# Patient Record
Sex: Male | Born: 1990 | Race: Black or African American | Hispanic: No | Marital: Single | Smoking: Current every day smoker
Health system: Southern US, Community
[De-identification: ages and names within clinical notes are randomized; demographics above are authoritative.]

---
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
## 2012-05-27 ENCOUNTER — Emergency Department (HOSPITAL_COMMUNITY): Payer: Self-pay

## 2012-05-27 ENCOUNTER — Encounter (HOSPITAL_COMMUNITY): Payer: Self-pay | Admitting: Emergency Medicine

## 2012-05-27 ENCOUNTER — Emergency Department (HOSPITAL_COMMUNITY)
Admission: EM | Admit: 2012-05-27 | Discharge: 2012-05-27 | Disposition: A | Discharge status: Home / Self Care | Payer: Self-pay | Attending: Emergency Medicine | Admitting: Emergency Medicine

## 2012-05-27 DIAGNOSIS — F17200 Nicotine dependence, unspecified, uncomplicated: Secondary | ICD-10-CM | POA: Insufficient documentation

## 2012-05-27 DIAGNOSIS — R0789 Other chest pain: Secondary | ICD-10-CM | POA: Insufficient documentation

## 2012-05-27 DIAGNOSIS — R079 Chest pain, unspecified: Secondary | ICD-10-CM

## 2012-05-27 MED ORDER — NAPROXEN 500 MG PO TABS: 500.0000 mg | ORAL_TABLET | Freq: Two times a day (BID) | ORAL | Status: AC

## 2012-05-27 NOTE — ED Provider Notes (Signed)
History     CSN: [id number]  Arrival date & time 05/27/12  1030   First MD Initiated Contact with Patient 05/27/12 1045      Chief Complaint  Patient presents with  . Chest Pain    (Consider location/radiation/quality/duration/timing/severity/associated sxs/prior treatment) Patient is a 21 y.o. male presenting with chest pain. The history is provided by the patient.  Chest Pain    patient here complaining of chest pain off and on for the past 3-6 months. Located on the right side of his chest and described as sharp in nature. No associated anginal symptoms. Denies any dyspnea or diaphoresis.no fever or cough. No leg pain or swelling. No rashes. No syncope. Nothing makes the  Sx better or worse and no tx used pta  History reviewed. No pertinent past medical history.  History reviewed. No pertinent past surgical history.  No family history on file.  History  Substance Use Topics  . Smoking status: Current Every Day Smoker  . Smokeless tobacco: Not on file  . Alcohol Use: Yes      Review of Systems  Cardiovascular: Positive for chest pain.  All other systems reviewed and are negative.    Allergies  Review of patient's allergies indicates no known allergies.  Home Medications  No current outpatient prescriptions on file.  BP 121/64  Pulse 51  Temp 98.1 F (36.7 C) (Oral)  Resp 14  SpO2 98%  Physical Exam  Nursing note and vitals reviewed. Constitutional: He is oriented to person, place, and time. He appears well-developed and well-nourished.  Non-toxic appearance. No distress.  HENT:  Head: Normocephalic and atraumatic.  Eyes: Conjunctivae normal, EOM and lids are normal. Pupils are equal, round, and reactive to light.  Neck: Normal range of motion. Neck supple. No tracheal deviation present. No mass present.  Cardiovascular: Normal rate, regular rhythm and normal heart sounds.  Exam reveals no gallop.   No murmur heard. Pulmonary/Chest: Effort normal and  breath sounds normal. No stridor. No respiratory distress. He has no decreased breath sounds. He has no wheezes. He has no rhonchi. He has no rales.  Abdominal: Soft. Normal appearance and bowel sounds are normal. He exhibits no distension. There is no tenderness. There is no rebound and no CVA tenderness.  Musculoskeletal: Normal range of motion. He exhibits no edema and no tenderness.  Neurological: He is alert and oriented to person, place, and time. He has normal strength. No cranial nerve deficit or sensory deficit. GCS eye subscore is 4. GCS verbal subscore is 5. GCS motor subscore is 6.  Skin: Skin is warm and dry. No abrasion and no rash noted.  Psychiatric: He has a normal mood and affect. His speech is normal and behavior is normal.    ED Course  Procedures (including critical care time)  Labs Reviewed - No data to display No results found.   No diagnosis found.    MDM   Date: 05/27/2012  Rate: 68  Rhythm: normal sinus rhythm  QRS Axis: normal  Intervals: normal  ST/T Wave abnormalities: early repolarization  Conduction Disutrbances:none  Narrative Interpretation:   Old EKG Reviewed: none available   12:12 PM Chest x-ray is negative EKG is nonacute. No concern for ACS or PE. Heart size normal on chest x-ray. Patient will be given prescription for NSAIDs and will followup with Dr.       Toy Baker, MD 05/27/12 1213

## 2012-05-27 NOTE — ED Notes (Signed)
Reports chest pain upon waking up described as a sharpe pain  able to point specific to left chest that has been going on for a few months coming & going. Today while at work pain worse rated 7/10. Denies any other symptoms. now 5/10.

## 2012-05-27 NOTE — ED Notes (Signed)
Pt states he has had chest pain "off and on" for several months, states he woke up at 9am with sharp R sided CP. Pt denies N/V/SOB. Denies cough.

## 2012-11-08 ENCOUNTER — Encounter (HOSPITAL_COMMUNITY): Payer: Self-pay | Admitting: *Deleted

## 2012-11-08 ENCOUNTER — Emergency Department (HOSPITAL_COMMUNITY)
Admission: EM | Admit: 2012-11-08 | Discharge: 2012-11-08 | Disposition: A | Discharge status: Home / Self Care | Payer: No Typology Code available for payment source | Attending: Emergency Medicine | Admitting: Emergency Medicine

## 2012-11-08 DIAGNOSIS — F17200 Nicotine dependence, unspecified, uncomplicated: Secondary | ICD-10-CM | POA: Insufficient documentation

## 2012-11-08 DIAGNOSIS — S0993XA Unspecified injury of face, initial encounter: Secondary | ICD-10-CM | POA: Insufficient documentation

## 2012-11-08 DIAGNOSIS — Y9389 Activity, other specified: Secondary | ICD-10-CM | POA: Insufficient documentation

## 2012-11-08 DIAGNOSIS — Y92410 Unspecified street and highway as the place of occurrence of the external cause: Secondary | ICD-10-CM | POA: Insufficient documentation

## 2012-11-08 DIAGNOSIS — S0990XA Unspecified injury of head, initial encounter: Secondary | ICD-10-CM | POA: Insufficient documentation

## 2012-11-08 DIAGNOSIS — S199XXA Unspecified injury of neck, initial encounter: Secondary | ICD-10-CM | POA: Insufficient documentation

## 2012-11-08 MED ORDER — IBUPROFEN 800 MG PO TABS
800.0000 mg | ORAL_TABLET | Freq: Once | ORAL | Status: AC
  Administered 2012-11-08: 800 mg via ORAL
  Filled 2012-11-08: qty 1

## 2012-11-08 MED ORDER — NAPROXEN 500 MG PO TABS: 500.0000 mg | ORAL_TABLET | Freq: Two times a day (BID) | ORAL | Status: AC

## 2012-11-08 MED ORDER — CYCLOBENZAPRINE HCL 5 MG PO TABS: 5.0000 mg | ORAL_TABLET | Freq: Three times a day (TID) | ORAL | Status: AC | PRN

## 2012-11-08 NOTE — ED Provider Notes (Signed)
Medical screening examination/treatment/procedure(s) were performed by non-physician practitioner and as supervising physician I was immediately available for consultation/collaboration.  Janya Eveland, MD 11/08/12 2142 
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
## 2019-11-25 ENCOUNTER — Other Ambulatory Visit: Payer: Self-pay

## 2019-11-25 ENCOUNTER — Emergency Department (HOSPITAL_COMMUNITY)
Admission: EM | Admit: 2019-11-25 | Discharge: 2019-11-25 | Disposition: A | Discharge status: Home / Self Care | Payer: Self-pay | Attending: Emergency Medicine | Admitting: Emergency Medicine

## 2019-11-25 DIAGNOSIS — Y92410 Unspecified street and highway as the place of occurrence of the external cause: Secondary | ICD-10-CM | POA: Insufficient documentation

## 2019-11-25 DIAGNOSIS — R519 Headache, unspecified: Secondary | ICD-10-CM | POA: Insufficient documentation

## 2019-11-25 DIAGNOSIS — Y998 Other external cause status: Secondary | ICD-10-CM | POA: Insufficient documentation

## 2019-11-25 DIAGNOSIS — Y9389 Activity, other specified: Secondary | ICD-10-CM | POA: Insufficient documentation

## 2019-11-25 DIAGNOSIS — Z5321 Procedure and treatment not carried out due to patient leaving prior to being seen by health care provider: Secondary | ICD-10-CM | POA: Insufficient documentation

## 2019-11-25 NOTE — ED Notes (Signed)
No answer for room x 1 

## 2019-11-25 NOTE — ED Notes (Signed)
No answer for room x2 
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
